# Patient Record
Sex: Female | Born: 1995 | Race: White | Hispanic: No | Marital: Single | Smoking: Never smoker
Health system: Southern US, Community
[De-identification: ages and names within clinical notes are randomized; demographics above are authoritative.]

---
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
## 2020-07-08 ENCOUNTER — Other Ambulatory Visit: Payer: Self-pay

## 2020-07-08 ENCOUNTER — Emergency Department
Admission: EM | Admit: 2020-07-08 | Discharge: 2020-07-08 | Disposition: A | Discharge status: Home / Self Care | Payer: BC Managed Care – PPO | Attending: Emergency Medicine | Admitting: Emergency Medicine

## 2020-07-08 DIAGNOSIS — R112 Nausea with vomiting, unspecified: Secondary | ICD-10-CM | POA: Diagnosis present

## 2020-07-08 DIAGNOSIS — R197 Diarrhea, unspecified: Secondary | ICD-10-CM | POA: Insufficient documentation

## 2020-07-08 DIAGNOSIS — R109 Unspecified abdominal pain: Secondary | ICD-10-CM | POA: Insufficient documentation

## 2020-07-08 LAB — URINALYSIS, COMPLETE (UACMP) WITH MICROSCOPIC
Bilirubin Urine: NEGATIVE
Glucose, UA: NEGATIVE mg/dL
Hgb urine dipstick: NEGATIVE
Ketones, ur: 80 mg/dL — AB
Leukocytes,Ua: NEGATIVE
Nitrite: NEGATIVE
Protein, ur: NEGATIVE mg/dL
Specific Gravity, Urine: 1.029 (ref 1.005–1.030)
pH: 5 (ref 5.0–8.0)

## 2020-07-08 LAB — LIPASE, BLOOD: Lipase: 24 U/L (ref 11–51)

## 2020-07-08 LAB — COMPREHENSIVE METABOLIC PANEL
ALT: 17 U/L (ref 0–44)
AST: 20 U/L (ref 15–41)
Albumin: 4.2 g/dL (ref 3.5–5.0)
Alkaline Phosphatase: 61 U/L (ref 38–126)
Anion gap: 10 (ref 5–15)
BUN: 23 mg/dL — ABNORMAL HIGH (ref 6–20)
CO2: 23 mmol/L (ref 22–32)
Calcium: 9.1 mg/dL (ref 8.9–10.3)
Chloride: 106 mmol/L (ref 98–111)
Creatinine, Ser: 0.69 mg/dL (ref 0.44–1.00)
GFR, Estimated: >60 mL/min (ref >60)
Glucose, Bld: 127 mg/dL — ABNORMAL HIGH (ref 70–99)
Potassium: 3.8 mmol/L (ref 3.5–5.1)
Sodium: 139 mmol/L (ref 135–145)
Total Bilirubin: 1.1 mg/dL (ref 0.3–1.2)
Total Protein: 8.1 g/dL (ref 6.5–8.1)

## 2020-07-08 LAB — POC URINE PREG, ED: Preg Test, Ur: NEGATIVE

## 2020-07-08 LAB — CBC
HCT: 45.9 % (ref 36.0–46.0)
Hemoglobin: 15.3 g/dL — ABNORMAL HIGH (ref 12.0–15.0)
MCH: 27.8 pg (ref 26.0–34.0)
MCHC: 33.3 g/dL (ref 30.0–36.0)
MCV: 83.3 fL (ref 80.0–100.0)
Platelets: 406 10*3/uL — ABNORMAL HIGH (ref 150–400)
RBC: 5.51 MIL/uL — ABNORMAL HIGH (ref 3.87–5.11)
RDW: 14.1 % (ref 11.5–15.5)
WBC: 16.2 10*3/uL — ABNORMAL HIGH (ref 4.0–10.5)
nRBC: 0 % (ref 0.0–0.2)

## 2020-07-08 MED ORDER — ONDANSETRON HCL 4 MG PO TABS: 4.0000 mg | ORAL_TABLET | Freq: Three times a day (TID) | ORAL | 0 refills | Status: AC | PRN

## 2020-07-08 MED ORDER — ONDANSETRON HCL 4 MG/2ML IJ SOLN
4.0000 mg | Freq: Once | INTRAMUSCULAR | Status: AC
  Administered 2020-07-08: 4 mg via INTRAVENOUS
  Filled 2020-07-08: qty 2

## 2020-07-08 MED ORDER — SODIUM CHLORIDE 0.9 % IV BOLUS
1000.0000 mL | Freq: Once | INTRAVENOUS | Status: AC
  Administered 2020-07-08: 1000 mL via INTRAVENOUS

## 2020-07-08 NOTE — Discharge Instructions (Addendum)
Please seek medical attention for any high fevers, chest pain, shortness of breath, change in behavior, persistent vomiting, bloody stool or any other new or concerning symptoms.  

## 2020-07-08 NOTE — ED Triage Notes (Signed)
Pt c/o abd pain with N/V/D since earlier this morning. Pt is in NAD

## 2020-07-08 NOTE — ED Provider Notes (Signed)
[facility] Emergency Department Provider Note   ____________________________________________   I have reviewed the triage vital signs and the nursing notes.   HISTORY  Chief Complaint N/V/D  History limited by: Not Limited   HPI Hayley Morris is a 25 y.o. female who presents to the emergency department today because of concerns for nausea vomiting diarrhea and abdominal discomfort.  The patient states that symptoms started around 3:30 AM today.  She did eat some sushi last night but her sister ate the same rolls and her sister did not get sick.  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  Patient states that around 330 she started developing multiple episodes of diarrhea and vomiting.  She denies any blood in the diarrhea or vomit.  The nausea and vomiting was associated with abdominal pain.  She was complaining of some right-sided and then left-sided pain.  The patient did not have any measured fevers but felt hot and cold.  At the time my exam her nausea has improved.  She denies any history of any IBS type diagnoses.   Records reviewed. No pertinent past medical history.   History reviewed. No pertinent past medical history.  There are no problems to display for this patient.   History reviewed. No pertinent surgical history.  Prior to Admission medications   Not on File    Allergies Patient has no known allergies.  No family history on file.  Social History Social History   Tobacco Use  . Smoking status: Never Smoker  . Smokeless tobacco: Never Used  Vaping Use  . Vaping Use: Every day  Substance Use Topics  . Alcohol use: Yes  . Drug use: Not Currently    Review of Systems Constitutional: No fever/chills Eyes: No visual changes. ENT: No sore throat. Cardiovascular: Denies chest pain. Respiratory: Denies shortness of breath. Gastrointestinal: Positive for  abdominal pain, nausea, vomiting and diarrhea.  Genitourinary: Negative for dysuria. Musculoskeletal: Positive for low back pain. Skin: Negative for rash. Neurological: Negative for headaches, focal weakness or numbness.  ____________________________________________   PHYSICAL EXAM:  VITAL SIGNS: ED Triage Vitals  Enc Vitals Group     BP 07/08/20 1212 129/78     Pulse Rate 07/08/20 1212 (!) 125     Resp 07/08/20 1212 18     Temp 07/08/20 1212 98.3 F (36.8 C)     Temp Source 07/08/20 1210 Oral     SpO2 07/08/20 1212 97 %     Weight 07/08/20 1213 253 lb 8.5 oz (115 kg)     Height 07/08/20 1213 5\' 5"  (1.651 m)     Head Circumference --      Peak Flow --      Pain Score 07/08/20 1212 6   Constitutional: Alert and oriented.  Eyes: Conjunctivae are normal.  ENT      Head: Normocephalic and atraumatic.      Nose: No congestion/rhinnorhea.      Mouth/Throat: Mucous membranes are moist.      Neck: No stridor. Hematological/Lymphatic/Immunilogical: No cervical lymphadenopathy. Cardiovascular: Normal rate, regular rhythm.  No murmurs, rubs, or gallops.  Respiratory: Normal respiratory effort without tachypnea nor retractions. Breath sounds are clear and equal bilaterally. No wheezes/rales/rhonchi. Gastrointestinal: Soft and minimally tender, worse in the left abdomen.  Genitourinary: Deferred Musculoskeletal: Normal range of motion in all extremities. No lower extremity edema. Neurologic:  Normal speech and language. No gross focal neurologic  deficits are appreciated.  Skin:  Skin is warm, dry and intact. No rash noted. Psychiatric: Mood and affect are normal. Speech and behavior are normal. Patient exhibits appropriate insight and judgment.  ____________________________________________    LABS (pertinent positives/negatives)  Lipase 24 CBC wbc 16.2, hgb 15.3, plt 406 Upreg negative UA clear, ketones 80, rbc and wbc 0-5 CMP wnl except glu 127, BUN  23   ____________________________________________   EKG  None  ____________________________________________    RADIOLOGY  None ____________________________________________   PROCEDURES  Procedures  ____________________________________________   INITIAL IMPRESSION / ASSESSMENT AND PLAN / ED COURSE  Pertinent labs & imaging results that were available during my care of the patient were reviewed by me and considered in my medical decision making (see chart for details).   Patient presented to the emergency department today because of concerns for nausea vomiting diarrhea and abdominal pain.  On my exam patient had some mild tenderness primarily in the left abdomen.  Blood work shows a leukocytosis.  I did have a discussion with the patient about causes of nausea vomiting diarrhea and abdominal pain.  At this time I have a low suspicion for any significant acute infection.  I have very low suspicion for appendicitis given lack of right lower quadrant tenderness.  The patient felt comfortable deferring imaging at this time which I think is reasonable.  She was given IV fluids and had no further episodes of vomiting.  Will plan on discharging home with prescription for antiemetic.   ____________________________________________   FINAL CLINICAL IMPRESSION(S) / ED DIAGNOSES  Final diagnoses:  Nausea vomiting and diarrhea     Note: This dictation was prepared with Dragon dictation. Any transcriptional errors that result from this process are unintentional     Phineas Semen, MD 07/08/20 1912
